# Patient Record
Sex: Male | Born: 1965 | Race: Asian | Hispanic: No | Marital: Married
Health system: Southern US, Community
[De-identification: ages and names within clinical notes are randomized; demographics above are authoritative.]

---

## 2016-08-02 DIAGNOSIS — Z1211 Encounter for screening for malignant neoplasm of colon: Secondary | ICD-10-CM | POA: Diagnosis not present

## 2016-08-15 DIAGNOSIS — D122 Benign neoplasm of ascending colon: Secondary | ICD-10-CM | POA: Diagnosis not present

## 2016-08-15 DIAGNOSIS — K635 Polyp of colon: Secondary | ICD-10-CM | POA: Diagnosis not present

## 2016-08-15 DIAGNOSIS — Z1211 Encounter for screening for malignant neoplasm of colon: Secondary | ICD-10-CM | POA: Diagnosis not present

## 2016-10-12 DIAGNOSIS — H04123 Dry eye syndrome of bilateral lacrimal glands: Secondary | ICD-10-CM | POA: Diagnosis not present

## 2017-04-25 DIAGNOSIS — D649 Anemia, unspecified: Secondary | ICD-10-CM | POA: Diagnosis not present

## 2017-04-25 DIAGNOSIS — Z125 Encounter for screening for malignant neoplasm of prostate: Secondary | ICD-10-CM | POA: Diagnosis not present

## 2017-04-25 DIAGNOSIS — E7800 Pure hypercholesterolemia, unspecified: Secondary | ICD-10-CM | POA: Diagnosis not present

## 2017-05-01 DIAGNOSIS — K7689 Other specified diseases of liver: Secondary | ICD-10-CM | POA: Diagnosis not present

## 2017-05-01 DIAGNOSIS — Z0000 Encounter for general adult medical examination without abnormal findings: Secondary | ICD-10-CM | POA: Diagnosis not present

## 2017-05-02 ENCOUNTER — Other Ambulatory Visit: Payer: Self-pay | Admitting: Internal Medicine

## 2017-05-02 DIAGNOSIS — R945 Abnormal results of liver function studies: Secondary | ICD-10-CM

## 2017-05-18 ENCOUNTER — Ambulatory Visit
Admission: RE | Admit: 2017-05-18 | Discharge: 2017-05-18 | Disposition: A | Discharge status: Home / Self Care | Payer: BLUE CROSS/BLUE SHIELD | Source: Ambulatory Visit | Attending: Internal Medicine | Admitting: Internal Medicine

## 2017-05-18 DIAGNOSIS — R945 Abnormal results of liver function studies: Secondary | ICD-10-CM

## 2017-05-18 DIAGNOSIS — K769 Liver disease, unspecified: Secondary | ICD-10-CM | POA: Diagnosis not present

## 2017-05-24 DIAGNOSIS — K7689 Other specified diseases of liver: Secondary | ICD-10-CM | POA: Diagnosis not present

## 2017-05-29 DIAGNOSIS — R739 Hyperglycemia, unspecified: Secondary | ICD-10-CM | POA: Diagnosis not present

## 2017-05-29 DIAGNOSIS — E7800 Pure hypercholesterolemia, unspecified: Secondary | ICD-10-CM | POA: Diagnosis not present

## 2017-05-29 DIAGNOSIS — K7689 Other specified diseases of liver: Secondary | ICD-10-CM | POA: Diagnosis not present

## 2017-08-07 ENCOUNTER — Encounter: Payer: Self-pay | Admitting: *Deleted

## 2017-08-07 DIAGNOSIS — Z23 Encounter for immunization: Secondary | ICD-10-CM

## 2018-01-31 ENCOUNTER — Encounter: Payer: Self-pay | Admitting: *Deleted

## 2018-01-31 DIAGNOSIS — Z139 Encounter for screening, unspecified: Secondary | ICD-10-CM

## 2018-01-31 LAB — GLUCOSE, POCT (MANUAL RESULT ENTRY): POC GLUCOSE: 132 mg/dL — AB (ref 70–99)

## 2018-04-24 DIAGNOSIS — E7800 Pure hypercholesterolemia, unspecified: Secondary | ICD-10-CM | POA: Diagnosis not present

## 2018-04-24 DIAGNOSIS — R739 Hyperglycemia, unspecified: Secondary | ICD-10-CM | POA: Diagnosis not present

## 2018-05-02 DIAGNOSIS — Z0000 Encounter for general adult medical examination without abnormal findings: Secondary | ICD-10-CM | POA: Diagnosis not present

## 2018-05-14 DIAGNOSIS — B028 Zoster with other complications: Secondary | ICD-10-CM | POA: Diagnosis not present

## 2018-05-29 DIAGNOSIS — Z8619 Personal history of other infectious and parasitic diseases: Secondary | ICD-10-CM | POA: Diagnosis not present

## 2018-07-21 ENCOUNTER — Encounter (HOSPITAL_COMMUNITY): Payer: Self-pay | Admitting: Emergency Medicine

## 2018-07-21 ENCOUNTER — Emergency Department (HOSPITAL_COMMUNITY)
Admission: EM | Admit: 2018-07-21 | Discharge: 2018-07-21 | Disposition: A | Discharge status: Home / Self Care | Payer: BLUE CROSS/BLUE SHIELD | Attending: Emergency Medicine | Admitting: Emergency Medicine

## 2018-07-21 ENCOUNTER — Emergency Department (HOSPITAL_COMMUNITY): Payer: BLUE CROSS/BLUE SHIELD

## 2018-07-21 ENCOUNTER — Other Ambulatory Visit: Payer: Self-pay

## 2018-07-21 DIAGNOSIS — S299XXA Unspecified injury of thorax, initial encounter: Secondary | ICD-10-CM | POA: Diagnosis not present

## 2018-07-21 DIAGNOSIS — S42021A Displaced fracture of shaft of right clavicle, initial encounter for closed fracture: Secondary | ICD-10-CM | POA: Diagnosis not present

## 2018-07-21 DIAGNOSIS — Y999 Unspecified external cause status: Secondary | ICD-10-CM | POA: Diagnosis not present

## 2018-07-21 DIAGNOSIS — Y9302 Activity, running: Secondary | ICD-10-CM | POA: Diagnosis not present

## 2018-07-21 DIAGNOSIS — W010XXA Fall on same level from slipping, tripping and stumbling without subsequent striking against object, initial encounter: Secondary | ICD-10-CM | POA: Diagnosis not present

## 2018-07-21 DIAGNOSIS — S4991XA Unspecified injury of right shoulder and upper arm, initial encounter: Secondary | ICD-10-CM | POA: Diagnosis not present

## 2018-07-21 DIAGNOSIS — R0781 Pleurodynia: Secondary | ICD-10-CM | POA: Diagnosis not present

## 2018-07-21 DIAGNOSIS — Y929 Unspecified place or not applicable: Secondary | ICD-10-CM | POA: Diagnosis not present

## 2018-07-21 DIAGNOSIS — W19XXXA Unspecified fall, initial encounter: Secondary | ICD-10-CM

## 2018-07-21 DIAGNOSIS — S42031A Displaced fracture of lateral end of right clavicle, initial encounter for closed fracture: Secondary | ICD-10-CM | POA: Insufficient documentation

## 2018-07-21 NOTE — ED Notes (Signed)
Patient verbalizes understanding of discharge instructions. Opportunity for questioning and answers were provided. Armband removed by staff, pt discharged from ED ambulatory with wife.  

## 2018-07-21 NOTE — ED Triage Notes (Signed)
Pt st;s he was running in a relay and fell.  Pt c/o pain to right clavicle area.  Deformity present

## 2018-07-21 NOTE — Discharge Instructions (Addendum)
You were seen in the ER after a fall.  X-rays done today of your right clavicle, right shoulder and right ribs show a stable fracture of the lateral aspect of your clavicle.    I spoke to Dr. Duwayne HeckJason Rogers with [facility] who has reviewed your x-rays.  He does not recommend surgery.    He recommends wearing shoulder sling for the next 2 weeks for comfort, you may remove it for showering and changing.  At 2 weeks, begin light range of motion exercises but avoid lifting anything heavier than a coffee mug for a total of 6 to 8 weeks.  Dr. Duwayne HeckJason Rogers can accommodate you in his office this week for reevaluation before your trip.  Call his office tomorrow morning to make an appointment, and let him know that you were seen in the emergency department today and that he has reviewed your x-rays.  Take [phone] mg acetaminophen and/or 600 mg ibuprofen for pain. Ice.   Return to the ER sooner if pain, swelling worsen or if you have loss of sensation or heaviness or purple/blue discoloration to your fingertips.

## 2018-07-21 NOTE — ED Notes (Signed)
Patient transported to X-ray 

## 2018-07-21 NOTE — ED Provider Notes (Signed)
[facility] EMERGENCY DEPARTMENT Provider Note   CSN: [id number] Arrival date & time: 07/21/18  1837     History   Chief Complaint Chief Complaint  Patient presents with  . Clavicle Injury    HPI Timothy Dunlap is a 52 y.o. male here for evaluation of injuries sustained after mechanical fall. He reports pain, swelling, deformity to right clavicle, right anterior shoulder and right lateral rib pain. Moderate to severe, sharp. Worse with palpation, movement of right arm above head level. Patient was running at a relay race and fell directly on his right shoulder with right arm tucked underneath him. Pain was sudden, constant. He is LHD. He denies associated distal RUE paresthesias, loss of sensation. No associated head trauma, LOC, SOB. No anticoagulants. No interventions PTA. Pain alleviated by rest.   HPI  History reviewed. No pertinent past medical history.  There are no active problems to display for this patient.   History reviewed. No pertinent surgical history.      Home Medications    Prior to Admission medications   Not on File    Family History No family history on file.  Social History Social History   Tobacco Use  . Smoking status: Not on file  Substance Use Topics  . Alcohol use: Not on file  . Drug use: Not on file     Allergies   Patient has no allergy information on record.   Review of Systems Review of Systems  All other systems reviewed and are negative.    Physical Exam Updated Vital Signs BP (!) 153/102 (BP Location: Left Arm)   Pulse (!) 2   Temp 98.8 F (37.1 C) (Oral)   Resp 16   Ht 5\' 6"  (1.676 m)   Wt 78.5 kg   SpO2 98%   BMI 27.92 kg/m   Physical Exam  Constitutional: He is oriented to person, place, and time. He appears well-developed and well-nourished. He is cooperative. He is easily aroused. No distress.  HENT:  Head: Atraumatic.  No abrasions, lacerations, deformity, defect, tenderness or crepitus  of facial, nasal, scalp bones. No Raccoon's eyes. No Battle's sign.   Eyes: Conjunctivae are normal.  Lids normal. EOMs and PERRL intact.   Neck:  C-spine: no midline or paraspinal muscular tenderness. Full active ROM of cervical spine w/o pain. Trachea midline  Cardiovascular: Normal rate, regular rhythm, S1 normal, S2 normal and normal heart sounds. Exam reveals no distant heart sounds.  Pulses:      Carotid pulses are 2+ on the right side, and 2+ on the left side.      Radial pulses are 2+ on the right side, and 2+ on the left side.       Dorsalis pedis pulses are 2+ on the right side, and 2+ on the left side.  2+ radial pulses bilaterally  Pulmonary/Chest: Effort normal and breath sounds normal. He has no decreased breath sounds. He exhibits tenderness.  Mild right upper lateral chest wall tenderness immediately bellow right axilla   Musculoskeletal: Normal range of motion. He exhibits tenderness and deformity.  Right upper extremity: obvious deformity and edema to right mid clavicle with appropriate tenderness, no ecchymosis or skin injury. No tenderness to [place] joint or AC joint. No tenderness to biceps groove, lateral deltoid, acromion or scapula.  Decreased right shoulder ROM secondary to pain. Non tender and normal right elbow and wrist.  T-spine: no paraspinal muscular tenderness or midline tenderness.   L-spine: no paraspinal muscular or  midline tenderness.   Neurological: He is alert, oriented to person, place, and time and easily aroused.  Strength 5/5 with hand grip and finger abduction and adduction bilaterally Sensation to light touch intact in median, ulnar and radial nerve distributions bilaterally  Skin: Skin is warm and dry. Capillary refill takes less than 2 seconds.  Psychiatric: His behavior is normal. Thought content normal.     ED Treatments / Results  Labs (all labs ordered are listed, but only abnormal results are displayed) Labs Reviewed - No data to  display  EKG None  Radiology Dg Ribs Unilateral W/chest Right  Result Date: 07/21/2018 CLINICAL DATA:  Fall, pain and deformity to RIGHT clavicle. EXAM: RIGHT RIBS AND CHEST - 3+ VIEW COMPARISON:  None. FINDINGS: Displaced fracture of the mid RIGHT clavicle, with slight superior angulation deformity at the fracture site. [facility] remains normal. Remainder of the visualized osseous structures about the RIGHT chest appear intact and normally aligned. No RIGHT-sided rib fracture or displacement seen. Heart size and mediastinal contours are within normal limits. Lungs are clear. No pleural effusion or pneumothorax seen. IMPRESSION: 1. Displaced fracture of the mid RIGHT clavicle, with slight superior angulation deformity at the fracture site. 2. No other osseous fracture or dislocation seen. No RIGHT-sided rib fracture or displacement seen. 3. Lungs are clear.  No pleural effusion or pneumothorax seen. Electronically Signed   By: Bary RichardStan  Maynard M.D.   On: 07/21/2018 20:24   Dg Clavicle Right  Result Date: 07/21/2018 CLINICAL DATA:  Was [place] the [place] today and fell, pain at RIGHT clavicle, deformity EXAM: RIGHT CLAVICLE - 2+ VIEWS COMPARISON:  None FINDINGS: Osseous mineralization normal. Comminuted fracture at junction of middle and distal thirds RIGHT clavicle with apex cranial angulation. Acromioclavicular and sternoclavicular joint alignments normal. No additional fracture, dislocation or bone destruction. IMPRESSION: Comminuted and angulated fracture of RIGHT clavicle at junction of middle and distal thirds. Electronically Signed   By: Ulyses SouthwardMark  Boles M.D.   On: 07/21/2018 19:25   Dg Shoulder Right  Result Date: 07/21/2018 CLINICAL DATA:  Fall.  Clavicle pain, deformity EXAM: RIGHT SHOULDER - 2+ VIEW COMPARISON:  None. FINDINGS: There is a fracture through the midportion of the right clavicle with inferior displacement and angulation of the distal fragment. AC joint and  glenohumeral joint appear intact. IMPRESSION: Mildly angulated and displaced mid right clavicle fracture. Electronically Signed   By: Charlett NoseKevin  Dover M.D.   On: 07/21/2018 20:23    Procedures Procedures (including critical care time)  Medications Ordered in ED Medications - No data to display   Initial Impression / Assessment and Plan / ED Course  I have reviewed the triage vital signs and the nursing notes.  Pertinent labs & imaging results that were available during my care of the patient were reviewed by me and considered in my medical decision making (see chart for details).  Clinical Course as of Jul 21 2037  Wynelle LinkSun Jul 21, 2018  2020 Spoke to Dr Aundria Rudogers who has reviewed patient's x-ray. He recommends sling x 2 weeks, light ROM at 2 weeks and avoiding lifting anything heavier than a coffee mug for 6-8 weeks. He can accommodate patient to be seen in his office this week. Pending remaining x-rays.    [CG]    Clinical Course User Index [CG] Liberty HandyGibbons, Claudia J, PA-C    X-ray confirms right clavicular fracture.  Extremities neurovascularly intact.  No signs of significant head, facial, C-spine, TL spine or chest trauma.  Negative right shoulder and right chest x-rays.  I spoke with Dr. Duwayne Heck who agrees with discharge with sling immobilizer.  He can accommodate patient in his office this week before patient's trip to [place].  Discussed recommendations with patient and wife.  Discussed return precautions.  Rice protocol.  Final Clinical Impressions(s) / ED Diagnoses   Final diagnoses:  Closed displaced fracture of acromial end of right clavicle, initial encounter  Fall, initial encounter    ED Discharge Orders    None       Jerrell Mylar 07/21/18 2038    Cathren Laine, MD 07/21/18 2253

## 2018-07-25 DIAGNOSIS — S42001A Fracture of unspecified part of right clavicle, initial encounter for closed fracture: Secondary | ICD-10-CM | POA: Diagnosis not present

## 2018-08-09 DIAGNOSIS — S42001A Fracture of unspecified part of right clavicle, initial encounter for closed fracture: Secondary | ICD-10-CM | POA: Diagnosis not present

## 2018-08-19 DIAGNOSIS — S42001D Fracture of unspecified part of right clavicle, subsequent encounter for fracture with routine healing: Secondary | ICD-10-CM | POA: Diagnosis not present

## 2018-08-26 DIAGNOSIS — S42001D Fracture of unspecified part of right clavicle, subsequent encounter for fracture with routine healing: Secondary | ICD-10-CM | POA: Diagnosis not present

## 2018-08-29 DIAGNOSIS — S42001A Fracture of unspecified part of right clavicle, initial encounter for closed fracture: Secondary | ICD-10-CM | POA: Diagnosis not present

## 2018-09-03 ENCOUNTER — Encounter: Payer: Self-pay | Admitting: *Deleted

## 2018-09-27 DIAGNOSIS — E7800 Pure hypercholesterolemia, unspecified: Secondary | ICD-10-CM | POA: Diagnosis not present

## 2018-09-27 DIAGNOSIS — R7309 Other abnormal glucose: Secondary | ICD-10-CM | POA: Diagnosis not present

## 2018-10-04 DIAGNOSIS — E785 Hyperlipidemia, unspecified: Secondary | ICD-10-CM | POA: Diagnosis not present

## 2018-10-04 DIAGNOSIS — Z23 Encounter for immunization: Secondary | ICD-10-CM | POA: Diagnosis not present

## 2018-10-10 DIAGNOSIS — S42001A Fracture of unspecified part of right clavicle, initial encounter for closed fracture: Secondary | ICD-10-CM | POA: Diagnosis not present

## 2018-12-04 DIAGNOSIS — Z23 Encounter for immunization: Secondary | ICD-10-CM | POA: Diagnosis not present

## 2019-05-01 DIAGNOSIS — R7309 Other abnormal glucose: Secondary | ICD-10-CM | POA: Diagnosis not present

## 2019-05-01 DIAGNOSIS — Z0000 Encounter for general adult medical examination without abnormal findings: Secondary | ICD-10-CM | POA: Diagnosis not present

## 2019-05-01 DIAGNOSIS — E7800 Pure hypercholesterolemia, unspecified: Secondary | ICD-10-CM | POA: Diagnosis not present

## 2019-05-01 DIAGNOSIS — Z125 Encounter for screening for malignant neoplasm of prostate: Secondary | ICD-10-CM | POA: Diagnosis not present

## 2019-05-01 DIAGNOSIS — K7689 Other specified diseases of liver: Secondary | ICD-10-CM | POA: Diagnosis not present

## 2019-05-08 DIAGNOSIS — Z0000 Encounter for general adult medical examination without abnormal findings: Secondary | ICD-10-CM | POA: Diagnosis not present

## 2019-09-25 DIAGNOSIS — Z20828 Contact with and (suspected) exposure to other viral communicable diseases: Secondary | ICD-10-CM | POA: Diagnosis not present

## 2019-11-04 IMAGING — DX DG CLAVICLE*R*
2 series · 2 of 2 positions shown · non-contrast
Comparison: None

CLINICAL DATA: Was [place] in the [place] today and fell, pain at
RIGHT clavicle, deformity

EXAM:
RIGHT CLAVICLE - 2+ VIEWS

[clavicle ap]
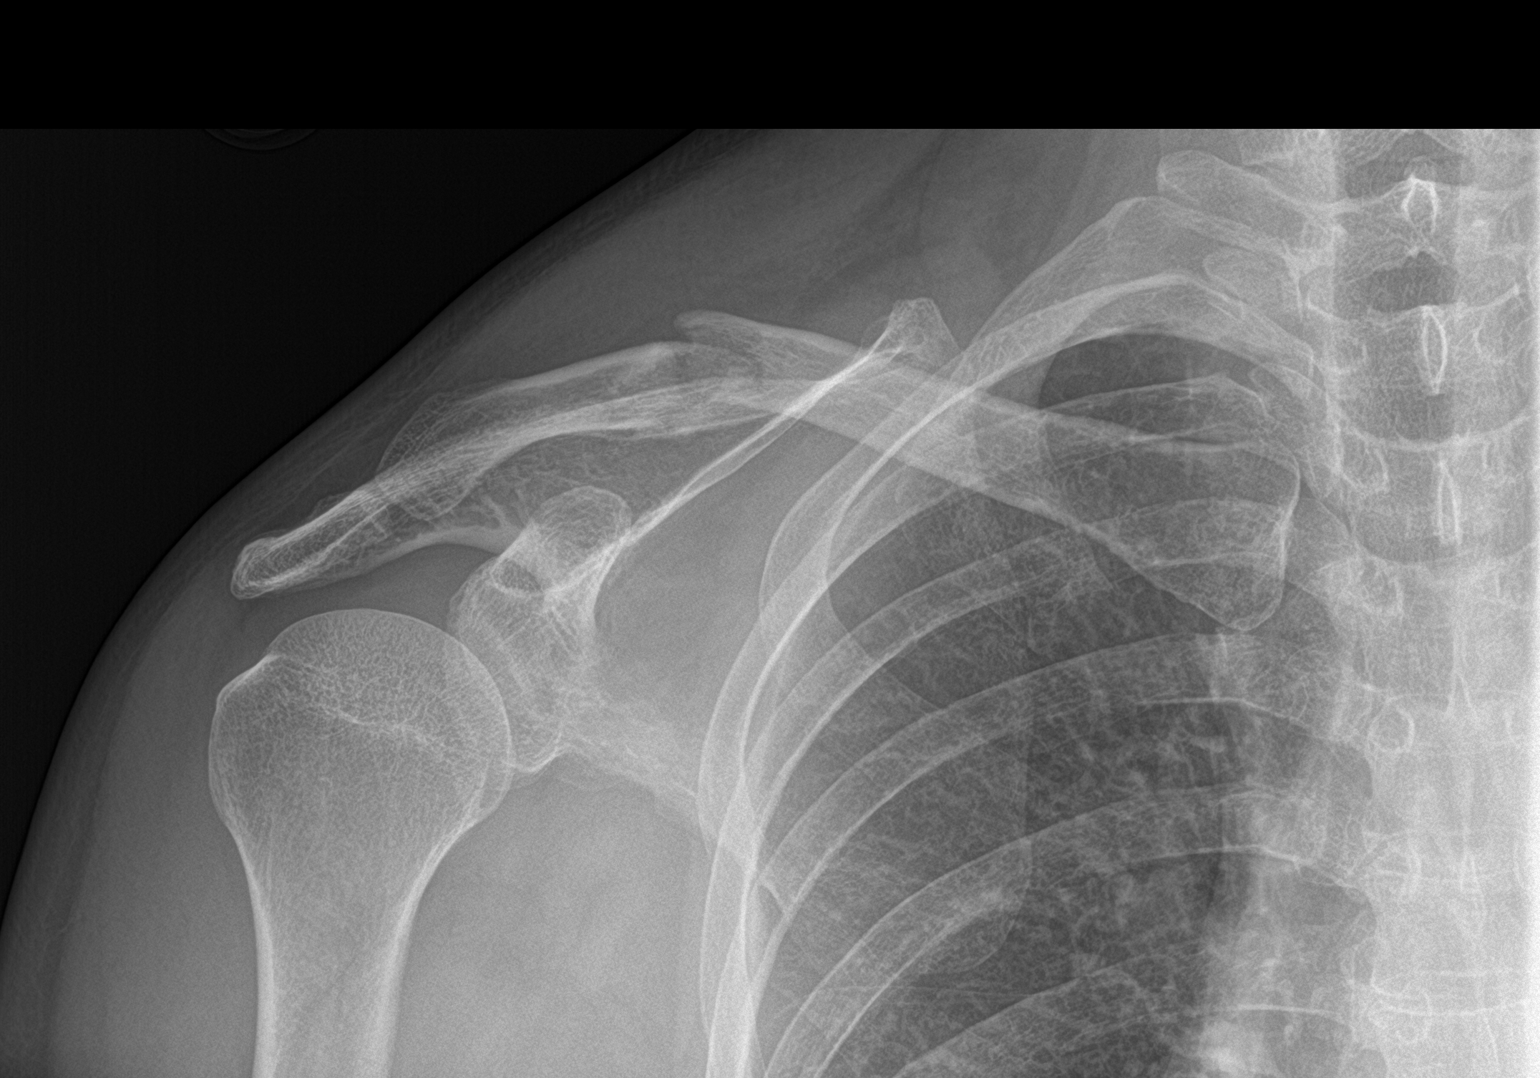

[clavicle axial]
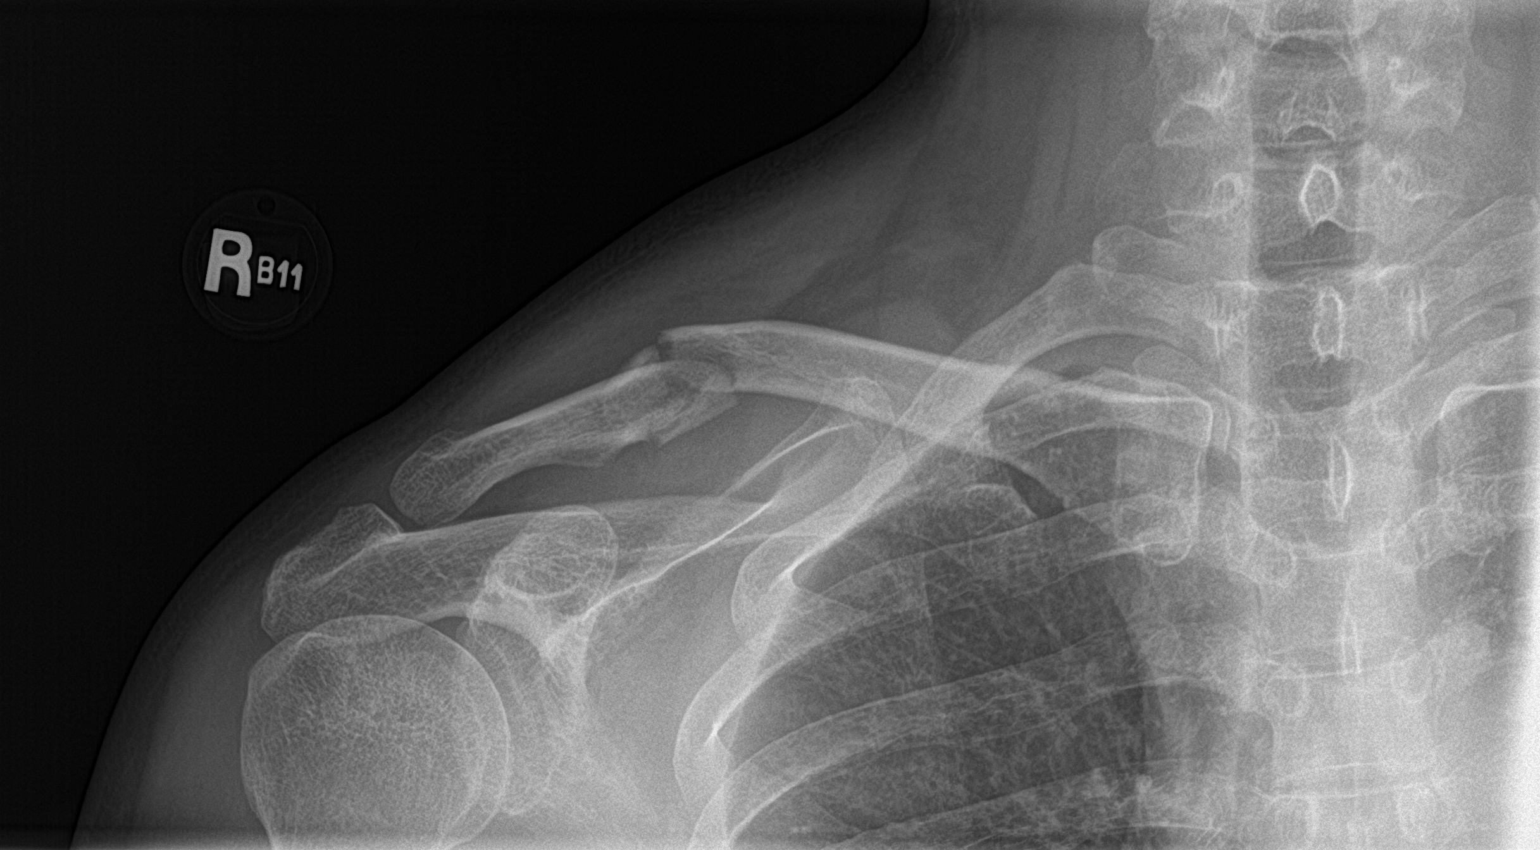

[2 of 2 positions shown; findings below may reference images not displayed]

FINDINGS: Osseous mineralization normal.

Comminuted fracture at junction of middle and distal thirds RIGHT
clavicle with apex cranial angulation.

Acromioclavicular and sternoclavicular joint alignments normal.

No additional fracture, dislocation or bone destruction.
IMPRESSION: Comminuted and angulated fracture of RIGHT clavicle at junction of
middle and distal thirds.

## 2019-11-04 IMAGING — DX DG RIBS W/ CHEST 3+V*R*
5 series · 5 of 5 positions shown · non-contrast
Comparison: None.

CLINICAL DATA: Fall, pain and deformity to RIGHT clavicle.

EXAM:
RIGHT RIBS AND CHEST - 3+ VIEW

[chest pa]
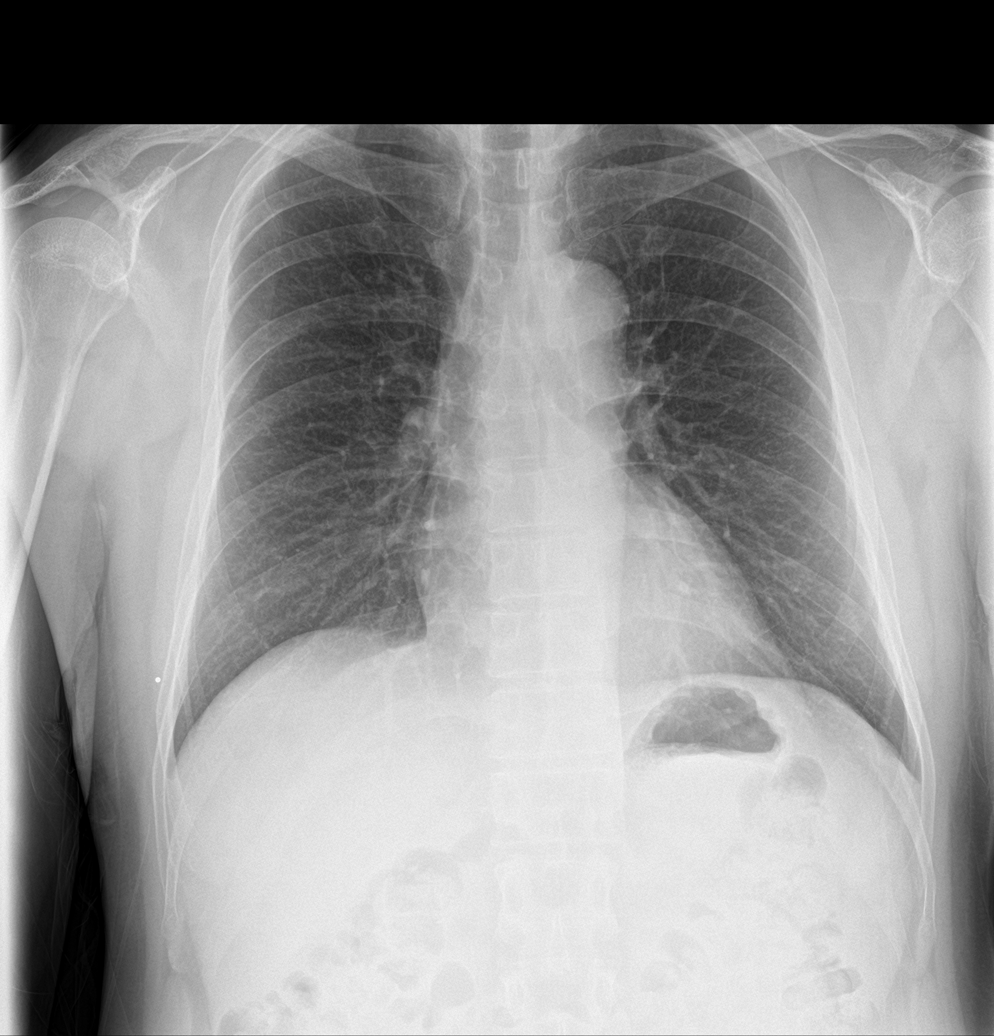

[rib pa (1 of 2)]
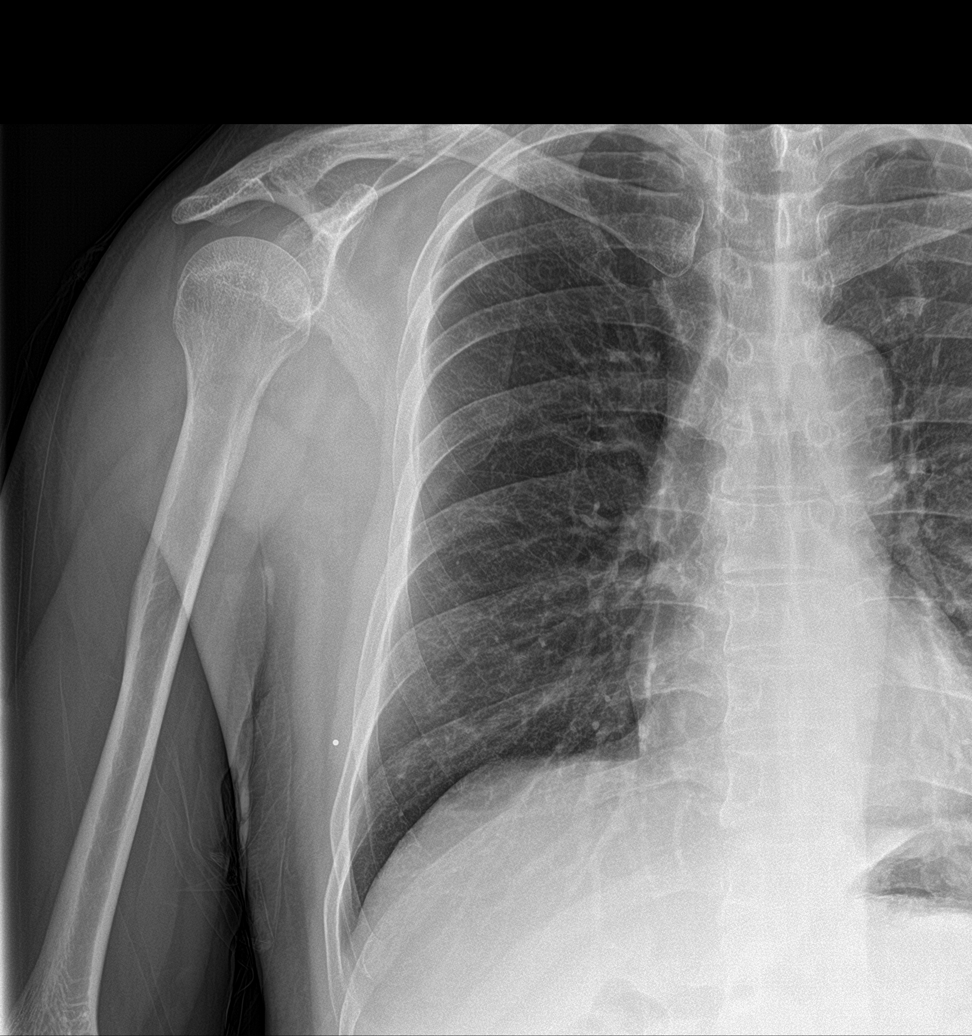

[rib pa obl (1 of 2)]
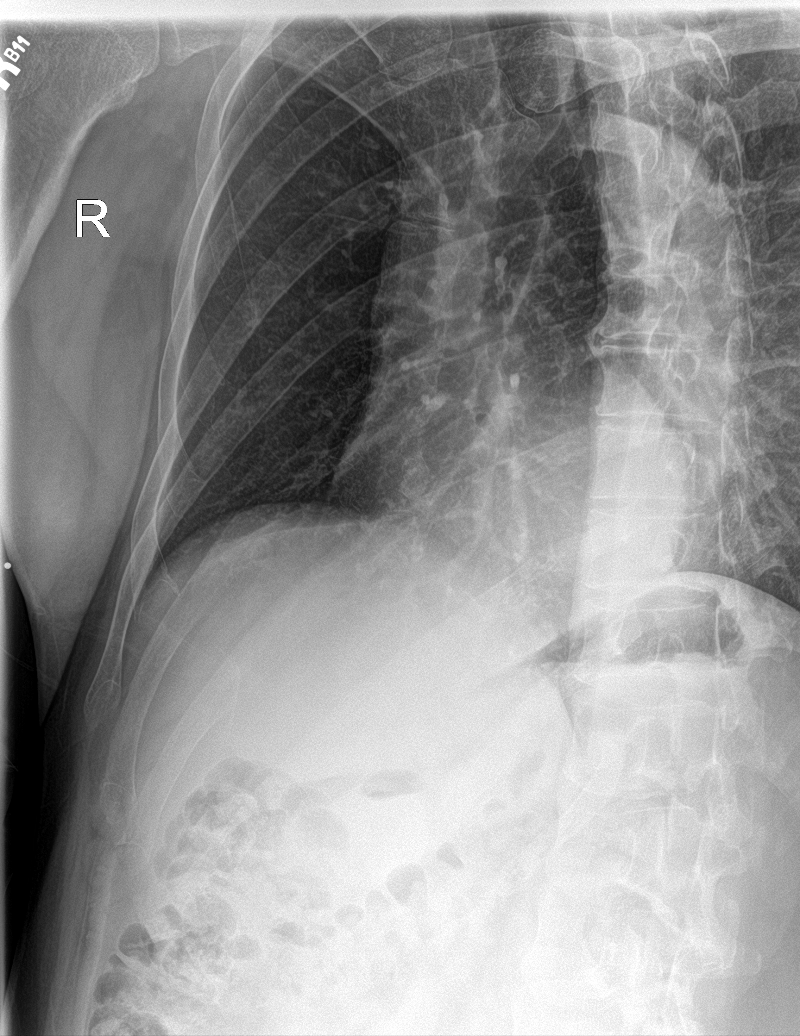

[rib pa obl (2 of 2)]
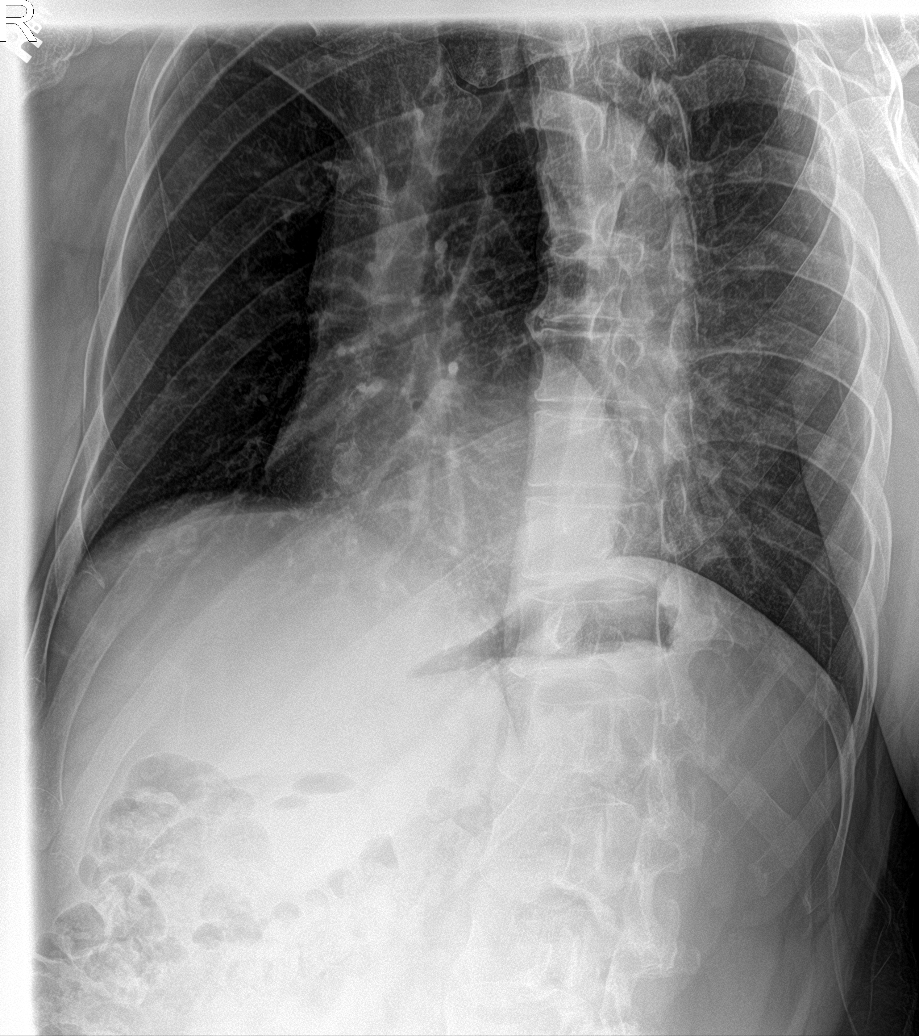

[rib pa (2 of 2)]
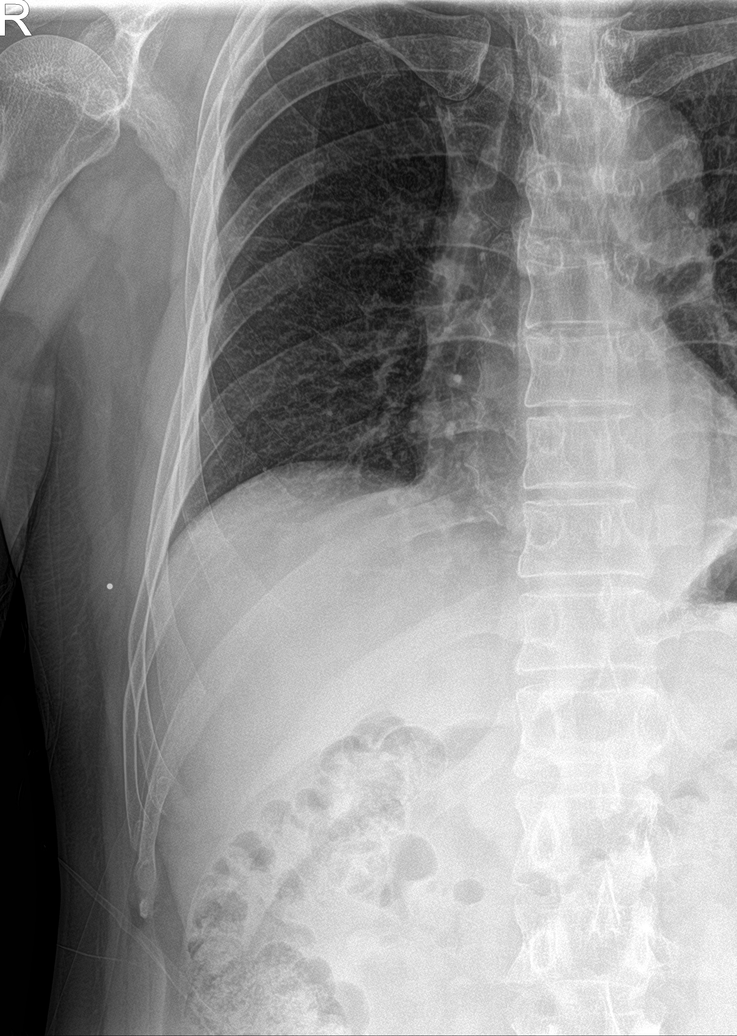

[5 of 5 positions shown; findings below may reference images not displayed]

FINDINGS: Displaced fracture of the mid RIGHT clavicle, with slight superior
angulation deformity at the fracture site. Alignment at the AC joint
remains normal. Remainder of the visualized osseous structures about
the RIGHT chest appear intact and normally aligned. No RIGHT-sided
rib fracture or displacement seen.

Heart size and mediastinal contours are within normal limits. Lungs
are clear. No pleural effusion or pneumothorax seen.
IMPRESSION: 1. Displaced fracture of the mid RIGHT clavicle, with slight
superior angulation deformity at the fracture site.
2. No other osseous fracture or dislocation seen. No RIGHT-sided rib
fracture or displacement seen.
3. Lungs are clear.  No pleural effusion or pneumothorax seen.

## 2020-05-03 DIAGNOSIS — R739 Hyperglycemia, unspecified: Secondary | ICD-10-CM | POA: Diagnosis not present

## 2020-05-03 DIAGNOSIS — Z0000 Encounter for general adult medical examination without abnormal findings: Secondary | ICD-10-CM | POA: Diagnosis not present

## 2020-05-12 DIAGNOSIS — Z0000 Encounter for general adult medical examination without abnormal findings: Secondary | ICD-10-CM | POA: Diagnosis not present

## 2020-08-01 ENCOUNTER — Encounter: Payer: Self-pay | Admitting: *Deleted

## 2021-03-07 DIAGNOSIS — S92515A Nondisplaced fracture of proximal phalanx of left lesser toe(s), initial encounter for closed fracture: Secondary | ICD-10-CM | POA: Diagnosis not present

## 2021-03-12 DIAGNOSIS — Z20822 Contact with and (suspected) exposure to covid-19: Secondary | ICD-10-CM | POA: Diagnosis not present

## 2021-05-10 DIAGNOSIS — E7801 Familial hypercholesterolemia: Secondary | ICD-10-CM | POA: Diagnosis not present

## 2021-05-10 DIAGNOSIS — Z125 Encounter for screening for malignant neoplasm of prostate: Secondary | ICD-10-CM | POA: Diagnosis not present

## 2021-05-10 DIAGNOSIS — R7309 Other abnormal glucose: Secondary | ICD-10-CM | POA: Diagnosis not present

## 2021-05-10 DIAGNOSIS — Z0000 Encounter for general adult medical examination without abnormal findings: Secondary | ICD-10-CM | POA: Diagnosis not present

## 2021-05-19 DIAGNOSIS — K219 Gastro-esophageal reflux disease without esophagitis: Secondary | ICD-10-CM | POA: Diagnosis not present

## 2021-05-19 DIAGNOSIS — Z0000 Encounter for general adult medical examination without abnormal findings: Secondary | ICD-10-CM | POA: Diagnosis not present

## 2021-05-19 DIAGNOSIS — E7800 Pure hypercholesterolemia, unspecified: Secondary | ICD-10-CM | POA: Diagnosis not present

## 2021-05-19 DIAGNOSIS — R7309 Other abnormal glucose: Secondary | ICD-10-CM | POA: Diagnosis not present

## 2021-06-05 DIAGNOSIS — Z20828 Contact with and (suspected) exposure to other viral communicable diseases: Secondary | ICD-10-CM | POA: Diagnosis not present

## 2021-07-27 DIAGNOSIS — Z1211 Encounter for screening for malignant neoplasm of colon: Secondary | ICD-10-CM | POA: Diagnosis not present

## 2021-09-20 DIAGNOSIS — Z1211 Encounter for screening for malignant neoplasm of colon: Secondary | ICD-10-CM | POA: Diagnosis not present

## 2022-03-21 DIAGNOSIS — B9689 Other specified bacterial agents as the cause of diseases classified elsewhere: Secondary | ICD-10-CM | POA: Diagnosis not present

## 2022-03-21 DIAGNOSIS — J019 Acute sinusitis, unspecified: Secondary | ICD-10-CM | POA: Diagnosis not present

## 2022-03-21 DIAGNOSIS — J040 Acute laryngitis: Secondary | ICD-10-CM | POA: Diagnosis not present

## 2022-05-25 DIAGNOSIS — E782 Mixed hyperlipidemia: Secondary | ICD-10-CM | POA: Diagnosis not present

## 2022-05-25 DIAGNOSIS — Z125 Encounter for screening for malignant neoplasm of prostate: Secondary | ICD-10-CM | POA: Diagnosis not present

## 2022-05-25 DIAGNOSIS — R7301 Impaired fasting glucose: Secondary | ICD-10-CM | POA: Diagnosis not present

## 2022-05-25 DIAGNOSIS — Z0000 Encounter for general adult medical examination without abnormal findings: Secondary | ICD-10-CM | POA: Diagnosis not present

## 2022-05-25 DIAGNOSIS — Z23 Encounter for immunization: Secondary | ICD-10-CM | POA: Diagnosis not present

## 2022-05-25 DIAGNOSIS — E559 Vitamin D deficiency, unspecified: Secondary | ICD-10-CM | POA: Diagnosis not present

## 2022-09-07 DIAGNOSIS — E559 Vitamin D deficiency, unspecified: Secondary | ICD-10-CM | POA: Diagnosis not present

## 2022-09-07 DIAGNOSIS — E782 Mixed hyperlipidemia: Secondary | ICD-10-CM | POA: Diagnosis not present

## 2022-09-07 DIAGNOSIS — R7301 Impaired fasting glucose: Secondary | ICD-10-CM | POA: Diagnosis not present

## 2022-09-14 DIAGNOSIS — R7301 Impaired fasting glucose: Secondary | ICD-10-CM | POA: Diagnosis not present

## 2022-09-14 DIAGNOSIS — R351 Nocturia: Secondary | ICD-10-CM | POA: Diagnosis not present

## 2022-09-14 DIAGNOSIS — Z23 Encounter for immunization: Secondary | ICD-10-CM | POA: Diagnosis not present

## 2022-09-14 DIAGNOSIS — E782 Mixed hyperlipidemia: Secondary | ICD-10-CM | POA: Diagnosis not present
